# Patient Record
Sex: Male | Born: 1985 | Race: Asian | Hispanic: No | Marital: Single | Smoking: Current every day smoker
Health system: Southern US, Community
[De-identification: ages and names within clinical notes are randomized; demographics above are authoritative.]

## PROBLEM LIST (undated history)

## (undated) DIAGNOSIS — F1210 Cannabis abuse, uncomplicated: Secondary | ICD-10-CM

## (undated) DIAGNOSIS — S0230XA Fracture of orbital floor, unspecified side, initial encounter for closed fracture: Secondary | ICD-10-CM

## (undated) DIAGNOSIS — R05 Cough: Secondary | ICD-10-CM

## (undated) HISTORY — PX: NO PAST SURGERIES: SHX2092

---
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
## 2006-11-10 ENCOUNTER — Emergency Department (HOSPITAL_COMMUNITY): Admission: EM | Admit: 2006-11-10 | Discharge: 2006-11-10 | Payer: Self-pay | Admitting: Family Medicine

## 2007-09-24 ENCOUNTER — Emergency Department (HOSPITAL_COMMUNITY): Admission: EM | Admit: 2007-09-24 | Discharge: 2007-09-24 | Payer: Self-pay | Admitting: Emergency Medicine

## 2007-11-22 ENCOUNTER — Emergency Department (HOSPITAL_COMMUNITY): Admission: EM | Admit: 2007-11-22 | Discharge: 2007-11-22 | Payer: Self-pay | Admitting: Emergency Medicine

## 2010-04-26 ENCOUNTER — Inpatient Hospital Stay (INDEPENDENT_AMBULATORY_CARE_PROVIDER_SITE_OTHER)
Admission: RE | Admit: 2010-04-26 | Discharge: 2010-04-26 | Disposition: A | Discharge status: Home / Self Care | Payer: Self-pay | Source: Ambulatory Visit | Attending: Emergency Medicine | Admitting: Emergency Medicine

## 2010-04-26 DIAGNOSIS — T148XXA Other injury of unspecified body region, initial encounter: Secondary | ICD-10-CM

## 2010-05-05 ENCOUNTER — Inpatient Hospital Stay (HOSPITAL_COMMUNITY)
Admission: RE | Admit: 2010-05-05 | Discharge: 2010-05-05 | Disposition: A | Discharge status: Home / Self Care | Payer: Self-pay | Source: Ambulatory Visit

## 2010-12-13 LAB — POCT RAPID STREP A: Streptococcus, Group A Screen (Direct): NEGATIVE

## 2011-08-22 ENCOUNTER — Encounter (HOSPITAL_COMMUNITY): Payer: Self-pay | Admitting: *Deleted

## 2011-08-22 ENCOUNTER — Emergency Department (HOSPITAL_COMMUNITY)
Admission: EM | Admit: 2011-08-22 | Discharge: 2011-08-23 | Disposition: A | Discharge status: Home / Self Care | Payer: 59 | Attending: Emergency Medicine | Admitting: Emergency Medicine

## 2011-08-22 ENCOUNTER — Emergency Department (HOSPITAL_COMMUNITY): Payer: 59

## 2011-08-22 DIAGNOSIS — S01502A Unspecified open wound of oral cavity, initial encounter: Secondary | ICD-10-CM | POA: Insufficient documentation

## 2011-08-22 DIAGNOSIS — R51 Headache: Secondary | ICD-10-CM | POA: Insufficient documentation

## 2011-08-22 DIAGNOSIS — S0280XA Fracture of other specified skull and facial bones, unspecified side, initial encounter for closed fracture: Secondary | ICD-10-CM | POA: Insufficient documentation

## 2011-08-22 DIAGNOSIS — S0230XA Fracture of orbital floor, unspecified side, initial encounter for closed fracture: Secondary | ICD-10-CM

## 2011-08-22 DIAGNOSIS — R1110 Vomiting, unspecified: Secondary | ICD-10-CM | POA: Insufficient documentation

## 2011-08-22 DIAGNOSIS — W208XXA Other cause of strike by thrown, projected or falling object, initial encounter: Secondary | ICD-10-CM | POA: Insufficient documentation

## 2011-08-22 DIAGNOSIS — R079 Chest pain, unspecified: Secondary | ICD-10-CM | POA: Insufficient documentation

## 2011-08-22 DIAGNOSIS — F17200 Nicotine dependence, unspecified, uncomplicated: Secondary | ICD-10-CM | POA: Insufficient documentation

## 2011-08-22 DIAGNOSIS — S02839A Fracture of medial orbital wall, unspecified side, initial encounter for closed fracture: Secondary | ICD-10-CM

## 2011-08-22 HISTORY — DX: Fracture of orbital floor, unspecified side, initial encounter for closed fracture: S02.30XA

## 2011-08-22 NOTE — ED Provider Notes (Signed)
History     CSN: [id number]  Arrival date & time 08/22/11  1958   First MD Initiated Contact with Patient 08/22/11 2102      Chief Complaint  Patient presents with  . Head Injury  . Chest Pain  . Dizziness    (Consider location/radiation/quality/duration/timing/severity/associated sxs/prior treatment) HPI Comments: Patient states that he was working on a car when the hood of the car fell and struck him on the back of the head causing his face to strike the motor of the car.  No LOC at the time but he reports continued dizziness and vomiting x 1 in the parking lot here.  He denies neck pain but also reports pain to left anterior chest wall as well.  Has small laceration under the left eye that is hemostatic.  He reports no further facial injuries with the exception of small laceration to inside of lower lip that is less than 1cm.  Teeth intact, able to open and close jaw without difficulty.  Patient is a 26 y.o. male presenting with head injury and chest pain. The history is provided by the patient. No language interpreter was used.  Head Injury  The incident occurred 3 to 5 hours ago. He came to the ER via walk-in. The injury mechanism was a direct blow. There was no loss of consciousness. The volume of blood lost was minimal. The quality of the pain is described as sharp. The pain is at a severity of 7/10. The pain is moderate. The pain has been constant since the injury. Associated symptoms include vomiting. Pertinent negatives include no numbness, no blurred vision, no tinnitus, patient does not experience disorientation, no weakness and no memory loss. He has tried nothing for the symptoms. The treatment provided no relief.  Chest Pain Primary symptoms include vomiting.  Pertinent negatives for associated symptoms include no numbness and no weakness.     History reviewed. No pertinent past medical history.  History reviewed. No pertinent past surgical history.  No family history  on file.  History  Substance Use Topics  . Smoking status: Current Everyday Smoker -- 0.5 packs/day  . Smokeless tobacco: Not on file  . Alcohol Use: No      Review of Systems  HENT: Positive for facial swelling. Negative for nosebleeds, neck pain, neck stiffness, dental problem and tinnitus.   Eyes: Negative for blurred vision, pain and visual disturbance.  Cardiovascular: Positive for chest pain.  Gastrointestinal: Positive for vomiting.  Neurological: Positive for headaches. Negative for weakness and numbness.  Psychiatric/Behavioral: Negative for memory loss.  All other systems reviewed and are negative.    Allergies  Review of patient's allergies indicates no known allergies.  Home Medications  No current outpatient prescriptions on file.  BP 120/81  Pulse 70  Temp 97.4 F (36.3 C) (Oral)  Resp 18  SpO2 99%  Physical Exam  Nursing note and vitals reviewed. Constitutional: He is oriented to person, place, and time. He appears well-developed and well-nourished. No distress.  HENT:  Head: Normocephalic.  Right Ear: External ear normal.  Left Ear: External ear normal.  Nose: Nose normal.  Mouth/Throat: No oropharyngeal exudate.       0.25 cm laceration to inside of lower lip.  1cm abrasion to left lower eyelid at the orbital rim with eccymosis.  Eyes: EOM are normal. Pupils are equal, round, and reactive to light. No scleral icterus.       Blurring of vision with upward gaze, no nystagmus, no hyphema.  No disconjugate gaze but patient reports double vision.  Neck: Normal range of motion. Neck supple. No spinous process tenderness and no muscular tenderness present.  Cardiovascular: Normal rate, regular rhythm and normal heart sounds.  Exam reveals no gallop and no friction rub.   No murmur heard. Pulmonary/Chest: Effort normal and breath sounds normal. No respiratory distress. He has no wheezes. He has no rales. He exhibits tenderness.         Mild ttp to left  anterior chest wall without crepitus, deformity, flail segment.  Abdominal: Soft. Bowel sounds are normal. He exhibits no distension. There is no tenderness. There is no rebound and no guarding.  Musculoskeletal: Normal range of motion. He exhibits no edema and no tenderness.  Lymphadenopathy:    He has no cervical adenopathy.  Neurological: He is alert and oriented to person, place, and time. No cranial nerve deficit. He exhibits normal muscle tone. Coordination normal.  Skin: Skin is warm and dry. No rash noted. No erythema. No pallor.  Psychiatric: He has a normal mood and affect. His behavior is normal. Judgment and thought content normal.    ED Course  Procedures (including critical care time)  Labs Reviewed - No data to display Ct Head Wo Contrast  08/22/2011  *RADIOLOGY REPORT*  Clinical Data:    Head injury.  Hit in head by car part.  Nausea and vomiting. Headache.  Laceration over left eye.  CT HEAD WITHOUT CONTRAST  Technique:  Contiguous axial images were obtained from the base of the skull through the vertex without contrast.  Comparison: None.  Findings: There is no evidence for acute stroke, intracranial hemorrhage, mass lesion, hydrocephalus, or extra-axial fluid. There is no atrophy or white matter disease.  There is slight left supraorbital soft tissue swelling.  Within limits of detection on routine head CT, both globes appear intact. On images 10 and 11, there is discontinuity of the lamina papyracea suggesting a medial blowout fracture.  It is unclear if this is acute, as there is little if any surrounding fluid.  Incompletely evaluated is an air-fluid level in the left maxillary sinus.  The left orbital floor appears displaced downward.  It is unclear if this could represent an acute injury, or may be a chronic injury.  Therefore CT maxillofacial recommended for further evaluation.  IMPRESSION: No intracranial hemorrhage or skull fracture. Slight left supraorbital soft tissue  swelling.  Air-fluid level in the left maxillary sinus with a deformity of the left orbital floor could indicate an occluded blowout injury.  CT maxillofacial recommended.  Medial left orbital blowout fracture of indeterminate age.  Again CT maxillofacial recommended.  Original Report Authenticated By: Elsie Stain, M.D.   Results for orders placed during the hospital encounter of 11/22/07  POCT RAPID STREP A (DEVICE)      Component Value Range   Streptococcus, Group A Screen (Direct) NEGATIVE     Ct Head Wo Contrast  08/22/2011  *RADIOLOGY REPORT*  Clinical Data:    Head injury.  Hit in head by car part.  Nausea and vomiting. Headache.  Laceration over left eye.  CT HEAD WITHOUT CONTRAST  Technique:  Contiguous axial images were obtained from the base of the skull through the vertex without contrast.  Comparison: None.  Findings: There is no evidence for acute stroke, intracranial hemorrhage, mass lesion, hydrocephalus, or extra-axial fluid. There is no atrophy or white matter disease.  There is slight left supraorbital soft tissue swelling.  Within limits of detection on routine  head CT, both globes appear intact. On images 10 and 11, there is discontinuity of the lamina papyracea suggesting a medial blowout fracture.  It is unclear if this is acute, as there is little if any surrounding fluid.  Incompletely evaluated is an air-fluid level in the left maxillary sinus.  The left orbital floor appears displaced downward.  It is unclear if this could represent an acute injury, or may be a chronic injury.  Therefore CT maxillofacial recommended for further evaluation.  IMPRESSION: No intracranial hemorrhage or skull fracture. Slight left supraorbital soft tissue swelling.  Air-fluid level in the left maxillary sinus with a deformity of the left orbital floor could indicate an occluded blowout injury.  CT maxillofacial recommended.  Medial left orbital blowout fracture of indeterminate age.  Again CT  maxillofacial recommended.  Original Report Authenticated By: Elsie Stain, M.D.   Ct Maxillofacial Wo Cm  08/22/2011  *RADIOLOGY REPORT*  Clinical Data: Head injury  CT MAXILLOFACIAL WITHOUT CONTRAST  Technique:  Multidetector CT imaging of the maxillofacial structures was performed. Multiplanar CT image reconstructions were also generated.  Comparison: 08/22/2011 head CT  Findings: Left orbital floor fracture with herniation of the extraconal fat and inferior rectus muscle into the defect.  There is mild associated stranding / thickening of the herniated muscle. Left medial orbital wall fracture with herniation of extracoronal fat.  The medial rectus muscle deviates toward the defect. The globes are symmetric. No exophthalmos.  The lenses are located. There is mild stranding of the intraconal fat (image 48 series 3) however no large retrobulbar hematoma.  Underpneumatized right frontal sinus.    Small amount of blood layers dependently within the left maxillary sinus. Otherwise, the visualized paranasal sinuses are clear.  Intact nasal bones, nasal septum, pterygoid plates, zygomatic arches, and mandible.  Upper cervical spine intact.  IMPRESSION: Left inferior and medial orbital wall fractures.  There is herniation of extracoronal fat into the defects.  In addition, the inferior rectus muscle herniates into the floor defect/maxillary sinus and demonstrates mild surrounding stranding.  Recommend clinical correlation for entrapment.  There is mild stranding of the intraconal fat however no large retrobulbar hematoma.  Discussed via telephone with Dr. Rubin Payor 11:40 p.m. on 08/22/2011.  Original Report Authenticated By: Waneta Martins, M.D.      Orbital fracture   MDM  Patient here with left inferior and medial wall fractures with entrapment of the inferior rectus muscle likely, clinically patient with double vision but no noticeable disconjugate gaze.  I have spoken with Dr. Lazarus Salines with ENT who  will see the patient in the office early next week.  He recommends opthalmology follow up as well as abx, ice and elevation.  I have spoken as well to Dr. Luciana Axe with opthalmology who will see the patient in his office in the morning.  Though the patient has double vision there is no blurring of vision and no evidence of hyphema.  Patient and his wife are aware of the plan.        Izola Price [place], [place] 08/23/11 0041

## 2011-08-22 NOTE — ED Notes (Signed)
Received report, pt. Alert and oriented, awaiting CT scan

## 2011-08-22 NOTE — ED Notes (Addendum)
Here s/p hood of car fell and hit head, c/o HA, CP & dizziness, also L eye pain (abrasion noted) scant blood in mouth, teeth and lip noted. "Sx intermitant, comes and goes based off of position and movement". Went to [facility] first, but they were closed. Vomited x1 in parking lot prior to triage. Also, "L eye is shaky & jaw is sore". (Denies: LOC, loose teeth, neck or back pain). Pt alert, NAD, calm interactive, skin W&D, resps e/u, speaking in clear complete sentences, "feels better lying down". L sclera redder than R. PERRL.

## 2011-08-22 NOTE — ED Notes (Signed)
Acuity changed d/t imaging results.

## 2011-08-23 MED ORDER — HYDROCODONE-ACETAMINOPHEN 5-325 MG PO TABS: 1.0000 | ORAL_TABLET | ORAL | Status: AC | PRN

## 2011-08-23 MED ORDER — CEPHALEXIN 500 MG PO CAPS: 500.0000 mg | ORAL_CAPSULE | Freq: Four times a day (QID) | ORAL | Status: AC

## 2011-08-23 NOTE — ED Notes (Signed)
Pt. Alert and oriented, ambulatory, gait steady, NAD noted

## 2011-08-23 NOTE — Discharge Instructions (Signed)
Orbital Floor Fracture, Blowout  The eye sits in the bony structure of the skull called the orbit. The upper and outside walls of the orbit are very thick and strong. These walls protect the eye if the head is struck from the top or side of the eye. However, the inside wall near the nose and the orbit floor are very thin and weak. The bony floor of the orbit also acts as the roof of the air-filled space (sinus) below the orbit.  If the eye receives a direct blow from the front, all the tissues around the eye are briefly pressed together. This makes the orbital wall pressure very high. Since the weakest walls tend to give way first, the inside wall or the orbit floor may break. If the floor fractures, the tissues around the eye, including the muscle that is used to make the eye look down, may become trapped within the fracture as the floor of the orbit "blows out" into the sinus below.   CAUSES   Orbital floor fractures are caused by direct (blunt) trauma to the region of the eye.  SYMPTOMS   Assuming that there has been no injury to the eye itself, symptoms can include:   Puffiness (swelling) and bruising around the eye area (black eye).   A gurgling sound when pressure is placed on the eye area. This sound comes from air that has escaped from the sinus into the space around the eye (orbital emphysema).   Seeing two of everything - one object being higher than the other (vertical diplopia). This is the result of the muscle that moves the eye down being trapped within the fracture. Since it cannot relax, the eye is being held in a downward position relative to the other eye and cannot look up. Vertical diplopia from an orbital floor fracture is worse when looking up.   Pain around the eye when looking up.   One eye looks sunken compared to the other eye (enophthalmos).   Numbness of the cheek and upper gum on the same side of the face with the floor fracture. This is a result of nerve injury to these areas.  This nerve runs in a groove along the bone of the orbital floor on its way to the cheek and upper gums.  DIAGNOSIS   The diagnosis of an orbital floor fracture is suspected during an eye exam by an ophthalmologist. It is confirmed by X-rays or CT scan of the eye region.  TREATMENT    Orbital floor fractures are not usually treated until all of the swelling around the eye has gone away. This may take 1 or 2 weeks. Once the swelling has gone down, an ophthalmologist will see if if the muscle below the eye is still trapped within the fracture.   If there is no sign of a trapped muscle or vertical diplopia, treatment is not necessary.   If there is double vision only when looking up, a decision may be made to not do anything since most people do not spend a lot of time looking up. This may depend on the person's profession. For instance, a plumber or electrician may spend a large part of their day looking up and would therefore need treatment.   If there is persistent vertical double vision even when looking straight ahead, the ophthalmologist may try to free the muscle in the office. If this is unsuccessful, surgery is often needed.  SEEK IMMEDIATE MEDICAL CARE IF:   You have had   A drop in vision in either eye.   Swelling and bruising around either eye.   One eye seems to be "sunken" compared to the other.   You see two of everything with both eyes open when looking in any direction.   The two images get further apart when looking in a certain direction - especially up.   You have numbness of the cheek and upper gums on the side of the injury.   You develop an unexplained oral temperature over 102 F (38.9 C), or as your caregiver suggests.  Document Released: 08/22/2000 Document Revised: 02/15/2011 Document Reviewed: 06/23/2007 [facility] Patient Information 2012 [place], [place].Orbital Floor Fracture Care After Refer to this sheet in the  next few weeks. These instructions provide you with information on caring for yourself after your procedure. Your caregiver may also give you more specific instructions. Your treatment has been planned according to current medical practices, but problems sometimes occur. Call your caregiver if you have any problems or questions after your procedure. HOME CARE INSTRUCTIONS   Follow your caregiver's instructions about activities, exercises, wearing contacts, and driving a car.   At home, an ice pack applied to your surgery site may help with discomfort and keep the swelling down.   Only take over-the-counter or prescription medicines for pain, discomfort, or fever as directed by your caregiver.   Avoid bending over and lifting until your caregiver says it is okay.  SEEK MEDICAL CARE IF:   You develop changes in vision, loss of vision, or double vision.   You develop increasing numbness in the cheek.   You notice a bad smell coming from the wound or dressing.   You notice pus or discharge coming from the wound or around the eye.   You develop increased bruising around the eye (orbit).   You develop redness, swelling, or increasing pain from the wound.   You or your child has an oral temperature above 102 F (38.9 C).  SEEK IMMEDIATE MEDICAL CARE IF:   You develop a rash.   You have difficulty breathing.  Document Released: 09/15/2004 Document Revised: 02/15/2011 Document Reviewed: 10/15/2007 [facility] Patient Information 2012 [place], [place].Facial Fracture A facial fracture is a break in one of the bones of your face. HOME CARE INSTRUCTIONS   Protect the injured part of your face until it is healed.   Do not participate in activities which give chance for re-injury until your doctor approves.   Gently wash and dry your face.   Wear head and facial protection while riding a bicycle, motorcycle, or snowmobile.  SEEK MEDICAL CARE IF:   An oral temperature above 102 F (38.9 C)  develops.   You have severe headaches or notice changes in your vision.   You have new numbness or tingling in your face.   You develop nausea (feeling sick to your stomach), vomiting or a stiff neck.  SEEK IMMEDIATE MEDICAL CARE IF:   You develop difficulty seeing or experience double vision.   You become dizzy, lightheaded, or faint.   You develop trouble speaking, breathing, or swallowing.   You have a watery discharge from your nose or ear.  MAKE SURE YOU:   Understand these instructions.   Will watch your condition.   Will get help right away if you are not doing well or get worse.  Document Released: 02/26/2005 Document Revised: 02/15/2011 Document Reviewed: 10/16/2007 [facility] Patient Information 2012 [place], [place].

## 2011-08-24 NOTE — ED Provider Notes (Signed)
Medical screening examination/treatment/procedure(s) were performed by non-physician practitioner and as supervising physician I was immediately available for consultation/collaboration.    Kayda Allers L Bransen [place], MD 08/24/11 1552 

## 2011-08-27 ENCOUNTER — Encounter (HOSPITAL_BASED_OUTPATIENT_CLINIC_OR_DEPARTMENT_OTHER): Payer: Self-pay | Admitting: *Deleted

## 2011-08-27 DIAGNOSIS — R059 Cough, unspecified: Secondary | ICD-10-CM

## 2011-08-27 HISTORY — DX: Cough, unspecified: R05.9

## 2011-08-27 NOTE — H&P (Signed)
Phillip Mathews, Phillip Mathews 26 y.o., male [id number]     Chief Complaint: LEFT medial and inferior orbital blowout fracture  HPI: 26 year old [place] male had the hood of the car come down on him as he was working under the hood.  This was 5 days ago.  He was evaluated in the emergency room and diagnosed with a large LEFT inferior and medial orbital blowout fracture.  He was instructed regarding ice, elevation, no nose blowing, and given prescriptions for antibiotics and pain medication.  He saw Dr. Luciana Axe 2 days ago who noted some restriction of upward gaze, and possible early cataract on the LEFT.  Since this time, the swelling has come down, and he feels like his vision is less blurry and range of motion is better and without discomfort.  He is passing some old bloody material from his nose.  On specific questioning, he has some numbness in the medial lower midface on the LEFT.  No prior history of injury.  RIGHT eye seems fine.  No hearing issues.  No jaw teeth or occlusion issues.  No neck pain.  No radiating neurologic symptoms to arms, legs, bowel, bladder.   I reviewed his CT scans.  He has a large LEFT medial and posterior orbital floor and medial wall blow out fracture with herniation of orbital contents into the maxillary and ethmoid sinuses.  He has a retrobulbar hematoma  PMH: Past Medical History  Diagnosis Date  . Orbital floor (blow-out) closed fracture 08/22/2011    left medial/inferior   . Cough 08/27/2011    productive of clear mucus    Surg Hx: Past Surgical History  Procedure Date  . No past surgeries     FHx:  History reviewed. No pertinent family history. SocHx:  reports that he has been smoking Cigarettes.  He has a 3.5 pack-year smoking history. He has never used smokeless tobacco. He reports that he uses illicit drugs (Marijuana). He reports that he does not drink alcohol.  ALLERGIES: No Known Allergies  No prescriptions prior to admission    No results found for  this or any previous visit (from the past 48 hour(s)). No results found.  WUJ:[id number]: Not feeling tired (fatigue).  No fever, no night sweats, and no recent weight loss. Head: No headache. Eyes: No eye symptoms. Otolaryngeal: No hearing loss, no earache, no tinnitus, and no purulent nasal discharge.  No nasal passage blockage, no snoring, no sneezing, no hoarseness, and no sore throat. Cardiovascular: No chest pain or discomfort  and no palpitations. Pulmonary: No dyspnea.  Cough.  No wheezing. Gastrointestinal: No dysphagia, no heartburn, no nausea, no abdominal pain, and no melena.  No diarrhea. Genitourinary: No dysuria. Endocrine: No muscle weakness. Musculoskeletal: No calf muscle cramps, no arthralgias, and no soft tissue swelling. Neurological: No dizziness, no fainting, no tingling, and no numbness. Psychological: No anxiety  and no depression. Skin: No rash. 12 system ROS was obtained and reviewed on the Health Maintenance form dated today.  Positive responses are shown above.  If the symptom is not checked, the patient has denied it.  Height 5\' 6"  (1.676 m).  PHYSICAL EXAM: BP:131/74,  HR: 57 b/min,  This is a pleasant healthy-appearing young adult Asian male.  He speaks good [place] with a mild accident.  Mental status is appropriate.  He hears well in conversational speech.  Voice is clear and respirations unlabored through the nose.  The head is atraumatic.  Bony facial contours are palpably intact.  He has some periorbital  ecchymosis on the LEFT, and slight chemosis and subconjunctival hemorrhage.  Vision is intact each eye.  He seems to have objective and subjective conjugate gaze in all major fields without diplopia.  Pupils are equal round and reactive.  Measuring to the dorsum of his nose, 14 mm on the RIGHT, and 17 mm on the LEFT.   Ears are clear with normal drums.  Internal nose is clear.  Oral cavity is clear with crowded teeth in fair to good repair.  Oropharynx  clear.  Neck unremarkable.  Lungs:  clear to auscultation Heart:  regular rate and rhythm, no murmurs Abd:  soft, active Ext:  nl config Neuro:  Intact and symmetric  Studies Reviewed:  Maxillofacial CT scan.  Dr. Ephriam Knuckles Ophth consult    Assessment/Plan . Open fracture of skull orbital floor (blow-out)   (802.7)   You have a large fracture under your LEFT eye.  This will allow the eye to sink back in over time, and it won't look so good, and may also have some double vision, even if you are OK now.  We are planning on repairing this tomorrow morning.  Once again, ice x 24 hrs elevation x 3-4 days, no nose blowing x 2 weeks.  recheck here 10 days.  We will observe you overnight for 24 hrs after the surgery.  Continue the antibiotics for 10 days after the surgery. Pain medication as needed.  Cephalexin 500 MG Oral Capsule;TAKE 1 CAPSULE 4 TIMES DAILY UNTIL GONE; Qty30; R0; Rx. Hydrocodone-Acetaminophen 5-325 MG Oral Tablet;TAKE 1 TO 2 TABLETS EVERY 4 TO 6 HOURS AS NEEDED FOR PAIN; Qty30; R2; Rx.  Phillip Mathews, Phillip Mathews 08/27/2011, 11:42 PM

## 2011-08-28 ENCOUNTER — Encounter (HOSPITAL_BASED_OUTPATIENT_CLINIC_OR_DEPARTMENT_OTHER): Payer: Self-pay

## 2011-08-28 ENCOUNTER — Encounter (HOSPITAL_BASED_OUTPATIENT_CLINIC_OR_DEPARTMENT_OTHER)
Admission: RE | Disposition: A | Discharge status: Home / Self Care | Payer: Self-pay | Source: Ambulatory Visit | Attending: Otolaryngology

## 2011-08-28 ENCOUNTER — Ambulatory Visit (HOSPITAL_BASED_OUTPATIENT_CLINIC_OR_DEPARTMENT_OTHER)
Admission: RE | Admit: 2011-08-28 | Discharge: 2011-08-28 | Disposition: A | Discharge status: Home / Self Care | Payer: 59 | Source: Ambulatory Visit | Attending: Otolaryngology | Admitting: Otolaryngology

## 2011-08-28 ENCOUNTER — Encounter (HOSPITAL_BASED_OUTPATIENT_CLINIC_OR_DEPARTMENT_OTHER): Payer: Self-pay | Admitting: Anesthesiology

## 2011-08-28 ENCOUNTER — Ambulatory Visit (HOSPITAL_BASED_OUTPATIENT_CLINIC_OR_DEPARTMENT_OTHER): Payer: 59 | Admitting: Anesthesiology

## 2011-08-28 DIAGNOSIS — W208XXA Other cause of strike by thrown, projected or falling object, initial encounter: Secondary | ICD-10-CM | POA: Insufficient documentation

## 2011-08-28 DIAGNOSIS — S0230XA Fracture of orbital floor, unspecified side, initial encounter for closed fracture: Secondary | ICD-10-CM | POA: Insufficient documentation

## 2011-08-28 DIAGNOSIS — Y998 Other external cause status: Secondary | ICD-10-CM | POA: Insufficient documentation

## 2011-08-28 DIAGNOSIS — K219 Gastro-esophageal reflux disease without esophagitis: Secondary | ICD-10-CM | POA: Insufficient documentation

## 2011-08-28 DIAGNOSIS — F17200 Nicotine dependence, unspecified, uncomplicated: Secondary | ICD-10-CM | POA: Insufficient documentation

## 2011-08-28 HISTORY — DX: Cough: R05

## 2011-08-28 HISTORY — DX: Fracture of orbital floor, unspecified side, initial encounter for closed fracture: S02.30XA

## 2011-08-28 HISTORY — PX: ORIF ORBITAL FRACTURE: SHX5312

## 2011-08-28 SURGERY — OPEN REDUCTION INTERNAL FIXATION (ORIF) ORBITAL FRACTURE
Anesthesia: General | Site: Eye | Laterality: Left | Wound class: Clean

## 2011-08-28 MED ORDER — MIDAZOLAM HCL 2 MG/2ML IJ SOLN: 0.5000 mg | Freq: Once | INTRAMUSCULAR | Status: DC | PRN

## 2011-08-28 MED ORDER — MORPHINE SULFATE 2 MG/ML IJ SOLN: 1.0000 mg | INTRAMUSCULAR | Status: DC | PRN

## 2011-08-28 MED ORDER — LIDOCAINE HCL (CARDIAC) 20 MG/ML IV SOLN
INTRAVENOUS | Status: DC | PRN
  Administered 2011-08-28: 50 mg via INTRAVENOUS

## 2011-08-28 MED ORDER — MIDAZOLAM HCL 5 MG/5ML IJ SOLN
INTRAMUSCULAR | Status: DC | PRN
  Administered 2011-08-28: 2 mg via INTRAVENOUS

## 2011-08-28 MED ORDER — NEOMYCIN-POLYMYXIN-DEXAMETH 3.5-10000-0.1 OP OINT
TOPICAL_OINTMENT | OPHTHALMIC | Status: DC | PRN
  Administered 2011-08-28: 1 via OPHTHALMIC

## 2011-08-28 MED ORDER — BACITRA-NEOMYCIN-POLYMYXIN-HC 1 % OP OINT: TOPICAL_OINTMENT | Freq: Four times a day (QID) | OPHTHALMIC | Status: DC

## 2011-08-28 MED ORDER — MEPERIDINE HCL 25 MG/ML IJ SOLN: 6.2500 mg | INTRAMUSCULAR | Status: DC | PRN

## 2011-08-28 MED ORDER — CEFAZOLIN SODIUM 1-5 GM-% IV SOLN
1.0000 g | INTRAVENOUS | Status: DC
  Administered 2011-08-28: 1 g via INTRAVENOUS

## 2011-08-28 MED ORDER — CEPHALEXIN 500 MG PO CAPS: 500.0000 mg | ORAL_CAPSULE | Freq: Four times a day (QID) | ORAL | Status: DC

## 2011-08-28 MED ORDER — BSS IO SOLN
INTRAOCULAR | Status: DC | PRN
  Administered 2011-08-28: 1 via INTRAOCULAR

## 2011-08-28 MED ORDER — CEPHALEXIN 500 MG PO CAPS
500.0000 mg | ORAL_CAPSULE | Freq: Four times a day (QID) | ORAL | Status: DC
  Administered 2011-08-28: 500 mg via ORAL

## 2011-08-28 MED ORDER — LACTATED RINGERS IV SOLN
INTRAVENOUS | Status: DC
  Administered 2011-08-28 (×2): via INTRAVENOUS

## 2011-08-28 MED ORDER — ONDANSETRON HCL 4 MG/2ML IJ SOLN
INTRAMUSCULAR | Status: DC | PRN
  Administered 2011-08-28: 4 mg via INTRAVENOUS

## 2011-08-28 MED ORDER — SODIUM CHLORIDE 0.9 % IV SOLN
INTRAVENOUS | Status: DC
  Administered 2011-08-28: 12:00:00 via INTRAVENOUS

## 2011-08-28 MED ORDER — ONDANSETRON HCL 4 MG PO TABS: 4.0000 mg | ORAL_TABLET | ORAL | Status: DC | PRN

## 2011-08-28 MED ORDER — FENTANYL CITRATE 0.05 MG/ML IJ SOLN
INTRAMUSCULAR | Status: DC | PRN
  Administered 2011-08-28 (×2): 25 ug via INTRAVENOUS
  Administered 2011-08-28: 50 ug via INTRAVENOUS
  Administered 2011-08-28: 100 ug via INTRAVENOUS
  Administered 2011-08-28: 25 ug via INTRAVENOUS
  Administered 2011-08-28: 50 ug via INTRAVENOUS
  Administered 2011-08-28: 25 ug via INTRAVENOUS

## 2011-08-28 MED ORDER — PROPOFOL 10 MG/ML IV EMUL
INTRAVENOUS | Status: DC | PRN
  Administered 2011-08-28: 300 mg via INTRAVENOUS

## 2011-08-28 MED ORDER — PROMETHAZINE HCL 25 MG/ML IJ SOLN: 6.2500 mg | INTRAMUSCULAR | Status: DC | PRN

## 2011-08-28 MED ORDER — 0.9 % SODIUM CHLORIDE (POUR BTL) OPTIME
TOPICAL | Status: DC | PRN
  Administered 2011-08-28: 100 mL

## 2011-08-28 MED ORDER — DEXAMETHASONE SODIUM PHOSPHATE 4 MG/ML IJ SOLN
INTRAMUSCULAR | Status: DC | PRN
  Administered 2011-08-28: 10 mg via INTRAVENOUS

## 2011-08-28 MED ORDER — HYDROCODONE-ACETAMINOPHEN 5-325 MG PO TABS: 1.0000 | ORAL_TABLET | ORAL | Status: DC | PRN

## 2011-08-28 MED ORDER — LIDOCAINE-EPINEPHRINE 1 %-1:100000 IJ SOLN
INTRAMUSCULAR | Status: DC | PRN
  Administered 2011-08-28: 4 mL

## 2011-08-28 MED ORDER — OXYCODONE-ACETAMINOPHEN 5-325 MG PO TABS: 1.0000 | ORAL_TABLET | ORAL | Status: AC | PRN

## 2011-08-28 MED ORDER — CEFAZOLIN SODIUM-DEXTROSE 2-3 GM-% IV SOLR: 2.0000 g | Freq: Once | INTRAVENOUS | Status: DC

## 2011-08-28 MED ORDER — ONDANSETRON HCL 4 MG/2ML IJ SOLN: 4.0000 mg | INTRAMUSCULAR | Status: DC | PRN

## 2011-08-28 MED ORDER — HYDROMORPHONE HCL PF 1 MG/ML IJ SOLN
0.2500 mg | INTRAMUSCULAR | Status: DC | PRN
  Administered 2011-08-28 (×2): 0.5 mg via INTRAVENOUS

## 2011-08-28 MED ORDER — SUCCINYLCHOLINE CHLORIDE 20 MG/ML IJ SOLN
INTRAMUSCULAR | Status: DC | PRN
  Administered 2011-08-28: 100 mg via INTRAVENOUS

## 2011-08-28 SURGICAL SUPPLY — 44 items
APPLICATOR DR MATTHEWS STRL (MISCELLANEOUS) IMPLANT
BIT DRILL UPPR FCE 1.0M 6 TWST (DRILL) ×2 IMPLANT
CANISTER SUCTION 1200CC (MISCELLANEOUS) ×3 IMPLANT
CLEANER CAUTERY TIP 5X5 PAD (MISCELLANEOUS) IMPLANT
CLOTH BEACON ORANGE TIMEOUT ST (SAFETY) ×3 IMPLANT
CORDS BIPOLAR (ELECTRODE) ×3 IMPLANT
DECANTER SPIKE VIAL GLASS SM (MISCELLANEOUS) ×3 IMPLANT
DEPRESSOR TONGUE BLADE STERILE (MISCELLANEOUS) IMPLANT
DRILL UPPERFACE 1.0M 6MM TWIST (DRILL) ×3
ELECT NEEDLE BLADE 2-5/6 (NEEDLE) ×3 IMPLANT
ELECT NEEDLE TIP 2.8 STRL (NEEDLE) IMPLANT
ELECT REM PT RETURN 9FT ADLT (ELECTROSURGICAL) ×3
ELECTRODE REM PT RTRN 9FT ADLT (ELECTROSURGICAL) ×2 IMPLANT
GLOVE ECLIPSE 8.0 STRL XLNG CF (GLOVE) ×3 IMPLANT
GLOVE SKINSENSE NS SZ7.0 (GLOVE) ×1
GLOVE SKINSENSE STRL SZ7.0 (GLOVE) ×2 IMPLANT
GOWN PREVENTION PLUS XLARGE (GOWN DISPOSABLE) ×3 IMPLANT
GOWN PREVENTION PLUS XXLARGE (GOWN DISPOSABLE) ×3 IMPLANT
NEEDLE BLUNT 17GA (NEEDLE) ×3 IMPLANT
NEEDLE HYPO 25X1 1.5 SAFETY (NEEDLE) ×3 IMPLANT
NS IRRIG 1000ML POUR BTL (IV SOLUTION) ×3 IMPLANT
PACK BASIN DAY SURGERY FS (CUSTOM PROCEDURE TRAY) ×3 IMPLANT
PACK ENT DAY SURGERY (CUSTOM PROCEDURE TRAY) ×3 IMPLANT
PAD CLEANER CAUTERY TIP 5X5 (MISCELLANEOUS)
PATTIES SURGICAL .5 X3 (DISPOSABLE) IMPLANT
PENCIL BUTTON HOLSTER BLD 10FT (ELECTRODE) ×3 IMPLANT
PLATE ORBITAL FLOOR 30 X 32 (Plate) ×3 IMPLANT
SCREW UPPERFACE 1.2X4M SLFDRIL (Screw) ×9 IMPLANT
SHEILD EYE MED CORNL SHD 22X21 (OPHTHALMIC RELATED) ×3
SHIELD EYE MED CORNL SHD 22X21 (OPHTHALMIC RELATED) ×2 IMPLANT
SLEEVE SCD COMPRESS KNEE MED (MISCELLANEOUS) ×3 IMPLANT
SUT CHROMIC 3 0 PS 2 (SUTURE) IMPLANT
SUT CHROMIC 4 0 P 3 18 (SUTURE) IMPLANT
SUT CHROMIC 4 0 PS 2 18 (SUTURE) IMPLANT
SUT ETHILON 4 0 CL P 3 (SUTURE) IMPLANT
SUT ETHILON 4 0 P 3 18 (SUTURE) IMPLANT
SUT ETHILON 5 0 P 3 18 (SUTURE) ×1
SUT ETHILON 6 0 P 1 (SUTURE) ×3 IMPLANT
SUT NYLON ETHILON 5-0 P-3 1X18 (SUTURE) ×2 IMPLANT
SUT PLAIN 5 0 P 3 18 (SUTURE) ×3 IMPLANT
SYR BULB 3OZ (MISCELLANEOUS) ×3 IMPLANT
TOWEL OR 17X24 6PK STRL BLUE (TOWEL DISPOSABLE) ×3 IMPLANT
TRAY DSU PREP LF (CUSTOM PROCEDURE TRAY) ×3 IMPLANT
WATER STERILE IRR 1000ML POUR (IV SOLUTION) IMPLANT

## 2011-08-28 NOTE — Anesthesia Preprocedure Evaluation (Signed)
Anesthesia Evaluation  Patient identified by MRN, date of birth, ID band Patient awake    Reviewed: Allergy & Precautions, H&P , NPO status , Patient's Chart, lab work & pertinent test results  History of Anesthesia Complications Negative for: history of anesthetic complications  Airway Mallampati: I TM Distance: >3 FB Neck ROM: Full    Dental No notable dental hx. (+) Teeth Intact and Dental Advisory Given   Pulmonary Current Smoker,  breath sounds clear to auscultation  Pulmonary exam normal       Cardiovascular negative cardio ROS  Rhythm:Regular Rate:Normal     Neuro/Psych negative neurological ROS     GI/Hepatic Neg liver ROS, GERD-  Controlled,  Endo/Other  negative endocrine ROS  Renal/GU negative Renal ROS     Musculoskeletal   Abdominal (+) + obese,   Peds  Hematology negative hematology ROS (+)   Anesthesia Other Findings   Reproductive/Obstetrics                           Anesthesia Physical Anesthesia Plan  ASA: II  Anesthesia Plan: General   Post-op Pain Management:    Induction: Intravenous  Airway Management Planned: Oral ETT  Additional Equipment:   Intra-op Plan:   Post-operative Plan: Extubation in OR  Informed Consent: I have reviewed the patients History and Physical, chart, labs and discussed the procedure including the risks, benefits and alternatives for the proposed anesthesia with the patient or authorized representative who has indicated his/her understanding and acceptance.   Dental advisory given  Plan Discussed with: Surgeon and CRNA  Anesthesia Plan Comments: (Plan routine monitors, GETA)        Anesthesia Quick Evaluation

## 2011-08-28 NOTE — Anesthesia Postprocedure Evaluation (Signed)
  Anesthesia Post-op Note  Patient: Phillip Mathews  Procedure(s) Performed: Procedure(s) (LRB): OPEN REDUCTION INTERNAL FIXATION (ORIF) ORBITAL FRACTURE (Left)  Patient Location: PACU  Anesthesia Type: General  Level of Consciousness: awake, alert  and oriented  Airway and Oxygen Therapy: Patient Spontanous Breathing  Post-op Pain: mild  Post-op Assessment: Post-op Vital signs reviewed, Patient's Cardiovascular Status Stable, Respiratory Function Stable, Patent Airway, No signs of Nausea or vomiting and Pain level controlled  Post-op Vital Signs: Reviewed and stable  Complications: No apparent anesthesia complications

## 2011-08-28 NOTE — Anesthesia Procedure Notes (Signed)
Procedure Name: Intubation Date/Time: 08/28/2011 8:01 AM Performed by: Zenia Resides D Pre-anesthesia Checklist: Patient identified, Emergency Drugs available, Suction available, Patient being monitored and Timeout performed Patient Re-evaluated:Patient Re-evaluated prior to inductionOxygen Delivery Method: Circle System Utilized Preoxygenation: Pre-oxygenation with 100% oxygen Intubation Type: IV induction Ventilation: Mask ventilation without difficulty Laryngoscope Size: Mac and 3 Grade View: Grade I Tube type: Oral Number of attempts: 1 Airway Equipment and Method: stylet and oral airway Placement Confirmation: ETT inserted through vocal cords under direct vision,  positive ETCO2 and breath sounds checked- equal and bilateral Secured at: 23 cm Tube secured with: Tape Dental Injury: Teeth and Oropharynx as per pre-operative assessment

## 2011-08-28 NOTE — Transfer of Care (Signed)
Immediate Anesthesia Transfer of Care Note  Patient: Phillip Mathews  Procedure(s) Performed: Procedure(s) (LRB): OPEN REDUCTION INTERNAL FIXATION (ORIF) ORBITAL FRACTURE (Left)  Patient Location: PACU  Anesthesia Type: General  Level of Consciousness: awake, alert  and oriented  Airway & Oxygen Therapy: Patient Spontanous Breathing and Patient connected to face mask oxygen  Post-op Assessment: Report given to PACU RN and Post -op Vital signs reviewed and stable  Post vital signs: Reviewed and stable  Complications: No apparent anesthesia complications

## 2011-08-28 NOTE — Op Note (Signed)
08/28/2011  10:11 AM    Hamstra, Alycia Patten  [id number]   Pre-Op Dx:   left orbital floor blowout fracture  Post-op Dx:  Same  Proc:  Orbital floor exploration and repair, blowout fracture   Surg:  Flo Shanks T MD  Anes:  GOT  EBL:  25 cc  Comp:  None  Findings:  Large comminuted depressed left orbital floor fracture beginning laterally behind the orbital rim and progressing medially across the infraorbital nerve and posteriorly.  Procedure:  With the patient in a comfortable supine position, general orotracheal anesthesia was induced without difficulty. At an appropriate level, the table was turned 90. The patient was placed in a semisitting position. The x-rays were reviewed. 1% Xylocaine with 1 100,000 epinephrine, 4 cc total was infiltrated into the infraorbital rim for intraoperative hemostasis. A sterile preparation and draping of the left midface was accomplished in the standard fashion.    CT scan was reviewed again. A sterile corneal shield was placed. A post septal approach was elected. An incision was made in the inferior conjunctival sac and carried down to the infraorbital rim.  The conjunctival flap was sewn to the upper lid after removing the corneal shield to protect the eye during surgery. The infraorbital rim was incised. Working on a broad front, the periorbita was elevated from the floor. The fracture was identified. The periphery of the fracture was developed on both sides. The orbital contents herniated down into the defect were gently and gradually elevated. Malleable retractors were used to hold back the orbital fat. These were relaxed every 1 to 2 minutes to allow orbital perfusion. The infraorbital nerve was identified in the herniated contents. The orbital fat was developed and elevated above the infraorbital nerve. I did not see evidence of the medial blowout fracture. It was not felt necessary to explore this further. Hemostasis was spontaneous.  At this  point an aluminum foil suture wrapper was cut to form a template and placed into the defect. This was repeated several times until adequate coverage from side to side and encompassing most of the front to back defect was accomplished. Leaving the aluminum foil in place, a titanium orbital floor malleable plate was customized to approximately the same size and shape. This was inserted beneath the foil which did a nice job of keeping orbital fat elevated. The plate was secured with 3 screws to the infraorbital rim. The aluminum template was removed.  Inspection revealed that the plate had adequate curvature to conform to the orbital floor, was supported on both sides, but did not go all the way to the posterior edge of the fracture approaching the orbital apex.  Again hemostasis was observed. The area was thoroughly irrigated.  The conjunctival flap was released and upper lid and the conjunctival incision was closed with a few interrupted 5-0 plain gut sutures. The eye was irrigated with balanced salt solution. Cortisporin Ophthalmic ointment was placed into the conjunctival sac. At this point the procedure was completed. The pharynx was suctioned of a tiny amount of clear secretions.  The patient was returned to anesthesia, awakened, extubated, and transferred to recovery in stable condition.   Dispo:   PACU to 23 hours extended recovery.   Plan:  Ice, elevation, analgesia, antibiosis, avoidance of nose blowing. We will see him back in 1 week. We will check a repeat maxillofacial CT scan around that time to assess the repair.  Cephus Richer MD

## 2011-08-28 NOTE — Discharge Instructions (Signed)
Keep head elevated for 3-4 days. No nose blowing x 2 weeks. Ice pack as desired. OK to rinse throat to clear old blood as needed. OK to return to work, light duty, Friday or Saturday You have your prescriptions for pain medication, and for 10 more days of antibiotics. Call for severe swelling around the eye, severe pain, any change in vision (try to lift your eyelid every 3-4 hours for the first several days and make sure you can still see out of the LEFT eye.) Eye ointment, small amt. To the LEFT eyeball 4 times daily.    Post Anesthesia Home Care Instructions  Activity: Get plenty of rest for the remainder of the day. A responsible adult should stay with you for 24 hours following the procedure.  For the next 24 hours, DO NOT: -Drive a car -Advertising copywriter -Drink alcoholic beverages -Take any medication unless instructed by your physician -Make any legal decisions or sign important papers.  Meals: Start with liquid foods such as gelatin or soup. Progress to regular foods as tolerated. Avoid greasy, spicy, heavy foods. If nausea and/or vomiting occur, drink only clear liquids until the nausea and/or vomiting subsides. Call your physician if vomiting continues.  Special Instructions/Symptoms: Your throat may feel dry or sore from the anesthesia or the breathing tube placed in your throat during surgery. If this causes discomfort, gargle with warm salt water. The discomfort should disappear within 24 hours.

## 2011-08-28 NOTE — Interval H&P Note (Signed)
History and Physical Interval Note:  08/28/2011 7:44 AM  Phillip Mathews  has presented today for surgery, with the diagnosis of left medial/inferior orbital blowout fracture  The various methods of treatment have been discussed with the patient and family. After consideration of risks, benefits and other options for treatment, the patient has consented to  Procedure(s) (LRB): OPEN REDUCTION INTERNAL FIXATION (ORIF) ZYGOMATIC FRACTURE (Left) as a surgical intervention .  The patient's history has been re-reviewed, patient re-examined, no change in status, stable for surgery.  I have re-reviewed the patient's chart and labs.  Questions were answered to the patient's satisfaction.     Flo Shanks

## 2011-08-31 LAB — POCT HEMOGLOBIN-HEMACUE: Hemoglobin: 16.2 g/dL (ref 13.0–17.0)

## 2011-09-05 ENCOUNTER — Encounter (HOSPITAL_BASED_OUTPATIENT_CLINIC_OR_DEPARTMENT_OTHER): Payer: Self-pay | Admitting: Otolaryngology

## 2011-09-12 ENCOUNTER — Other Ambulatory Visit (HOSPITAL_COMMUNITY): Payer: Self-pay | Admitting: Otolaryngology

## 2011-09-12 DIAGNOSIS — T148XXA Other injury of unspecified body region, initial encounter: Secondary | ICD-10-CM

## 2011-09-14 ENCOUNTER — Other Ambulatory Visit (HOSPITAL_COMMUNITY): Payer: 59

## 2011-09-17 ENCOUNTER — Ambulatory Visit (HOSPITAL_COMMUNITY)
Admission: RE | Admit: 2011-09-17 | Discharge: 2011-09-17 | Disposition: A | Discharge status: Home / Self Care | Payer: 59 | Source: Ambulatory Visit | Attending: Otolaryngology | Admitting: Otolaryngology

## 2011-09-17 DIAGNOSIS — Z09 Encounter for follow-up examination after completed treatment for conditions other than malignant neoplasm: Secondary | ICD-10-CM | POA: Insufficient documentation

## 2011-09-17 DIAGNOSIS — T148XXA Other injury of unspecified body region, initial encounter: Secondary | ICD-10-CM

## 2011-09-17 DIAGNOSIS — H532 Diplopia: Secondary | ICD-10-CM | POA: Insufficient documentation
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
## 2014-01-09 IMAGING — CT CT MAXILLOFACIAL W/O CM
3 of 5 series · 16 of 47 positions shown, 19 images · non-contrast
Comparison: 08/22/2011 head CT

CLINICAL DATA: Head injury

CT MAXILLOFACIAL WITHOUT CONTRAST
TECHNIQUE: Multidetector CT imaging of the maxillofacial
structures was performed. Multiplanar CT image reconstructions were
also generated.

[Series 3: recon 2: supine facial bones · axial · 0.39mm/px · z∈[+26,+166]mm · 12 of 66 slices shown, 15 images]
[im 5/66  brain]
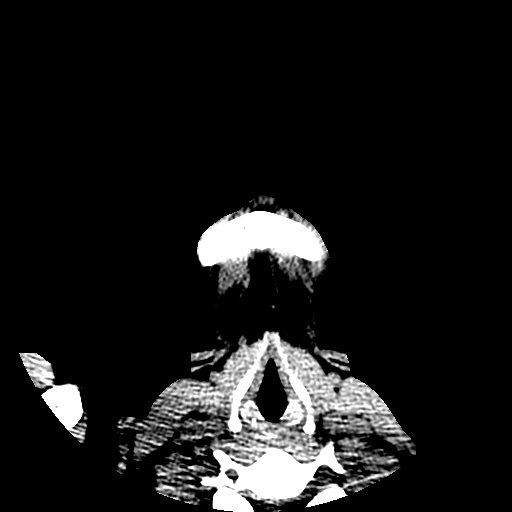
[im 5/66  bone]
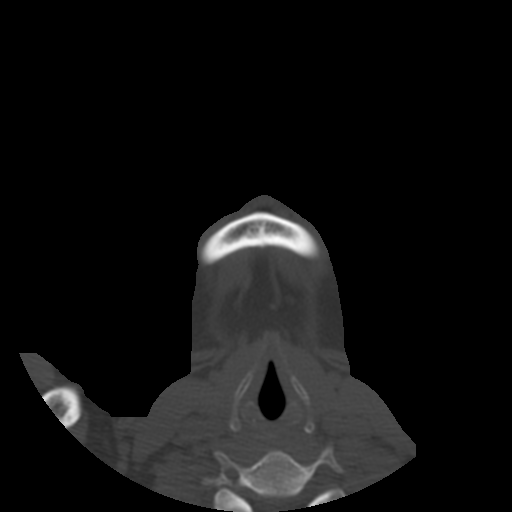
[im 9/66  bone]
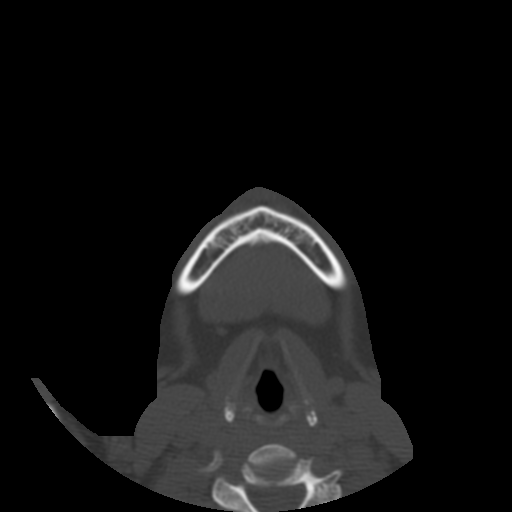
[im 14/66  bone]
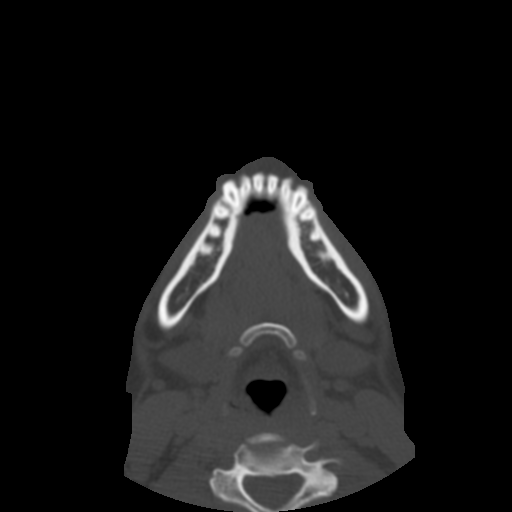
[im 21/66  bone]
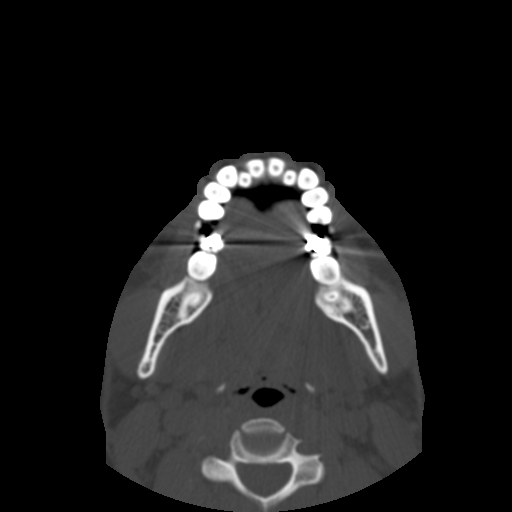
[im 25/66  brain]
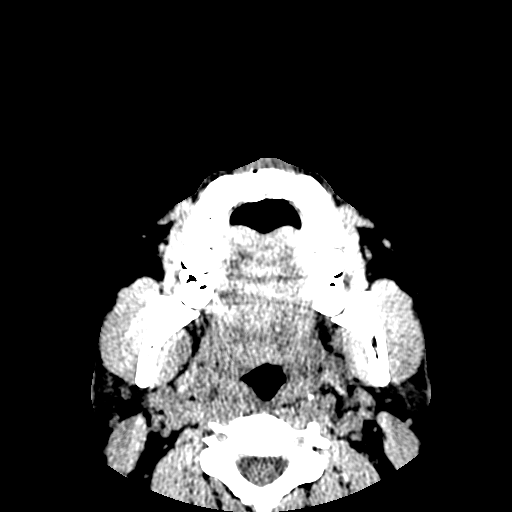
[im 25/66  bone]
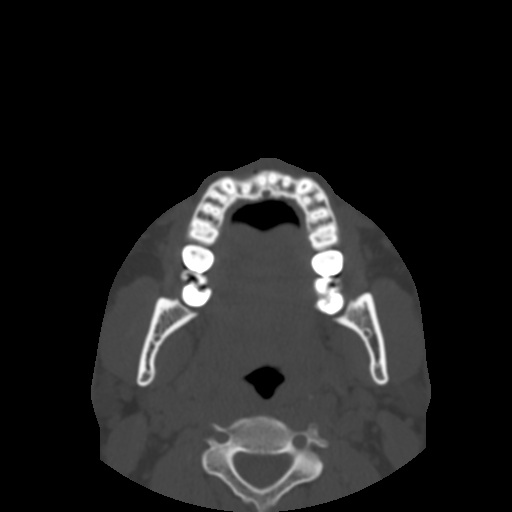
[im 30/66  bone]
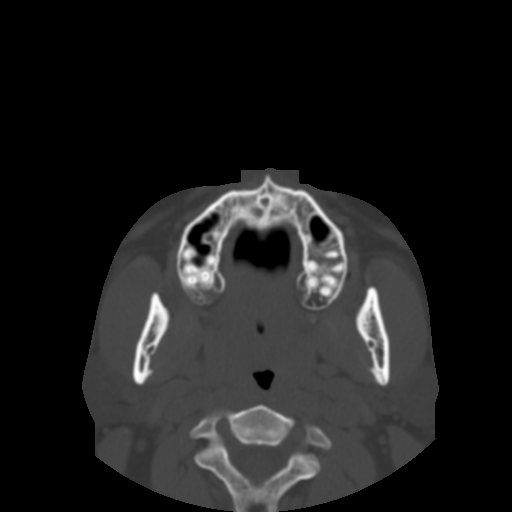
[im 36/66  bone]
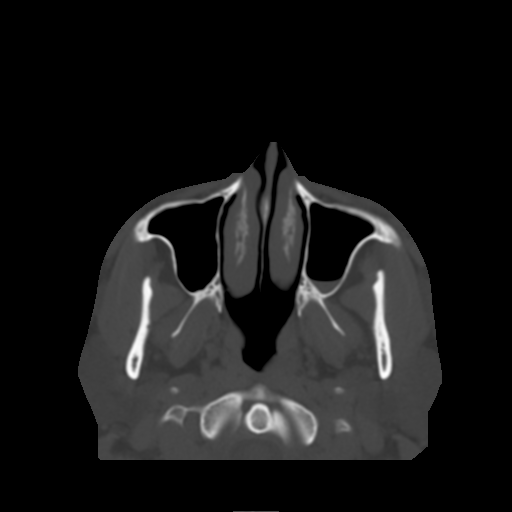
[im 41/66  bone]
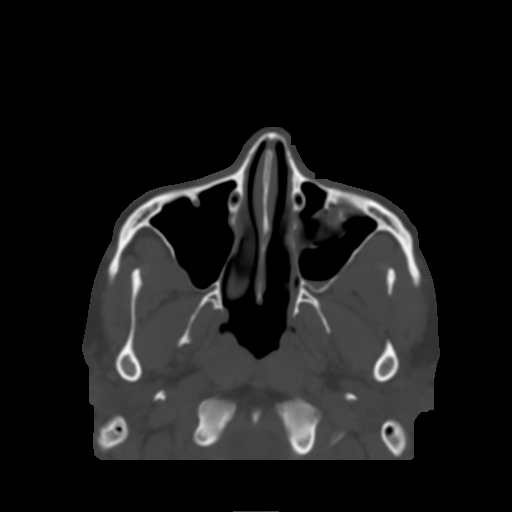
[im 45/66  brain]
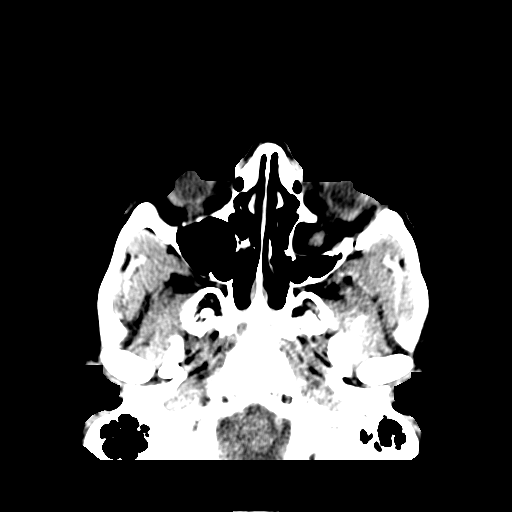
[im 45/66  bone]
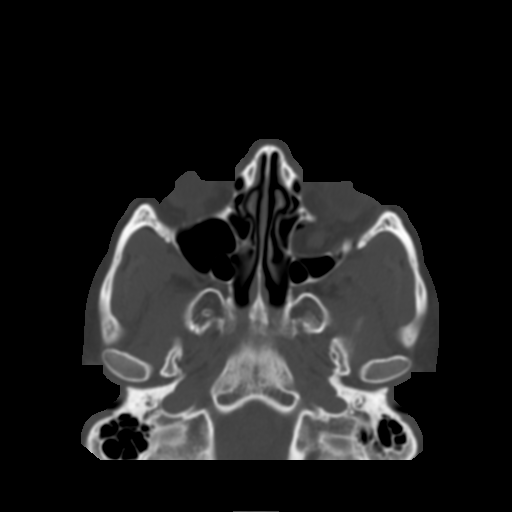
[im 52/66  bone]
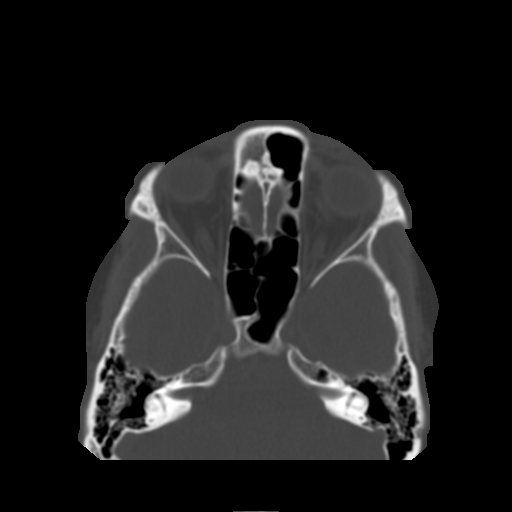
[im 57/66  bone]
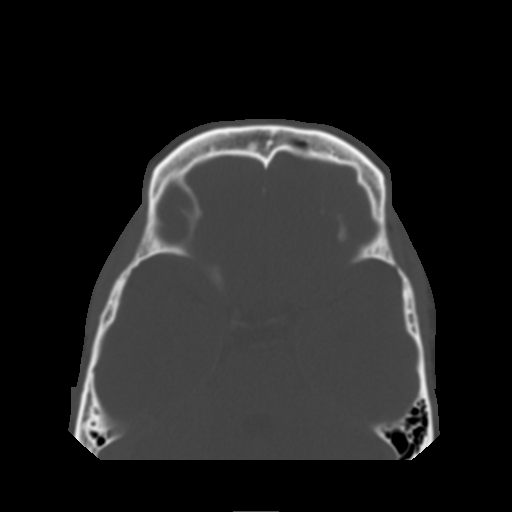
[im 61/66  bone]
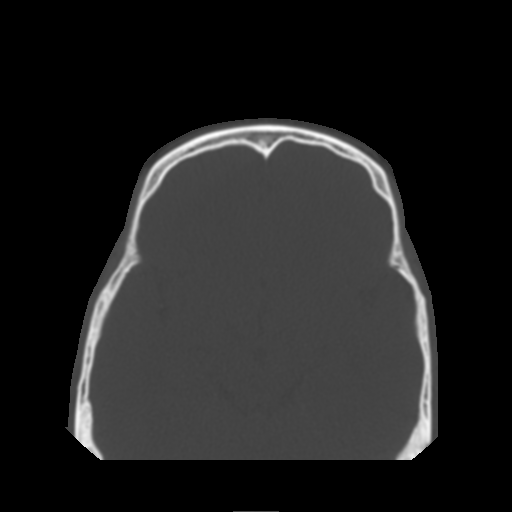

[Series 402: cor bone · coronal · 0.39mm/px · 3 of 78 slices shown]
[im 26/78  bone]
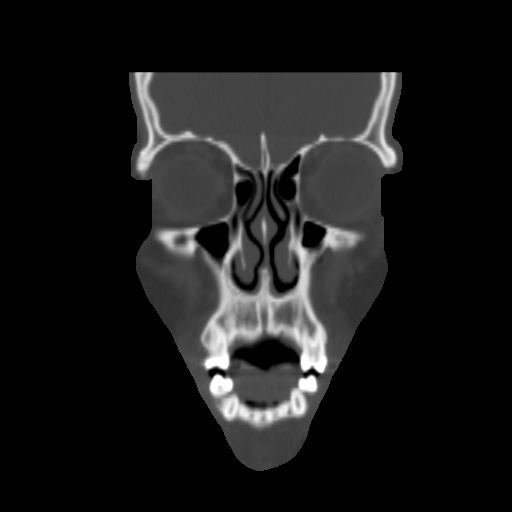
[im 39/78  bone]
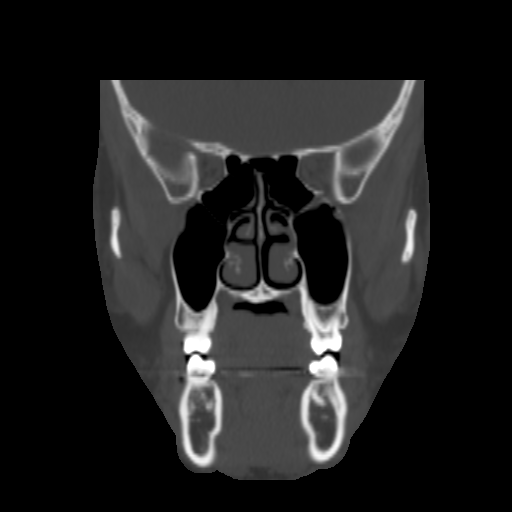
[im 52/78  bone]
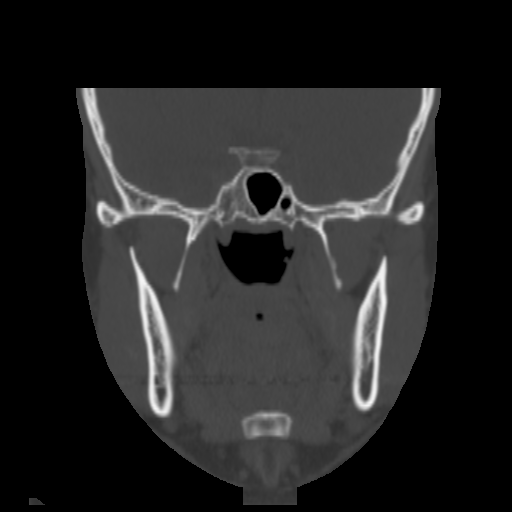

[Series 403: sag bone · sagittal · 0.39mm/px · 1 of 78 slices shown]
[im 39/78  bone]
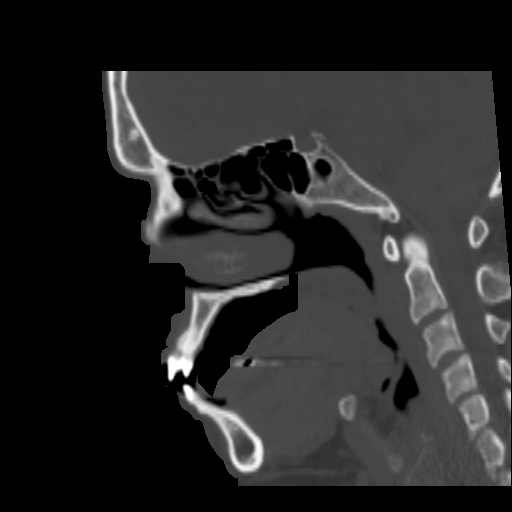

[16 of 47 positions shown; findings below may reference images not displayed]

FINDINGS: Left orbital floor fracture with herniation of the
extraconal fat and inferior rectus muscle into the defect.  There
is mild associated stranding / thickening of the herniated muscle.
Left medial orbital wall fracture with herniation of extracoronal
fat.  The medial rectus muscle deviates toward the defect. The
globes are symmetric. No exophthalmos.  The lenses are located.
There is mild stranding of the intraconal fat (image 48 series 3)
however no large retrobulbar hematoma.

Underpneumatized right frontal sinus.    Small amount of blood
layers dependently within the left maxillary sinus. Otherwise, the
visualized paranasal sinuses are clear.

Intact nasal bones, nasal septum, pterygoid plates, zygomatic
arches, and mandible.

Upper cervical spine intact.
IMPRESSION: Left inferior and medial orbital wall fractures.  There is
herniation of extracoronal fat into the defects.  In addition, the
inferior rectus muscle herniates into the floor defect/maxillary
sinus and demonstrates mild surrounding stranding.  Recommend
clinical correlation for entrapment.

There is mild stranding of the intraconal fat however no large
retrobulbar hematoma.

Discussed via telephone with Dr. Schulte [DATE] p.m. on
08/22/2011.

## 2014-07-08 ENCOUNTER — Encounter (HOSPITAL_COMMUNITY): Payer: Self-pay | Admitting: Emergency Medicine

## 2014-07-08 ENCOUNTER — Emergency Department (HOSPITAL_COMMUNITY): Payer: 59

## 2014-07-08 ENCOUNTER — Emergency Department (HOSPITAL_COMMUNITY)
Admission: EM | Admit: 2014-07-08 | Discharge: 2014-07-09 | Disposition: A | Discharge status: Home / Self Care | Payer: 59 | Attending: Emergency Medicine | Admitting: Emergency Medicine

## 2014-07-08 DIAGNOSIS — R05 Cough: Secondary | ICD-10-CM

## 2014-07-08 DIAGNOSIS — R509 Fever, unspecified: Secondary | ICD-10-CM

## 2014-07-08 DIAGNOSIS — J189 Pneumonia, unspecified organism: Secondary | ICD-10-CM

## 2014-07-08 DIAGNOSIS — R059 Cough, unspecified: Secondary | ICD-10-CM

## 2014-07-08 DIAGNOSIS — J159 Unspecified bacterial pneumonia: Secondary | ICD-10-CM | POA: Insufficient documentation

## 2014-07-08 DIAGNOSIS — Z720 Tobacco use: Secondary | ICD-10-CM | POA: Insufficient documentation

## 2014-07-08 DIAGNOSIS — Z8781 Personal history of (healed) traumatic fracture: Secondary | ICD-10-CM | POA: Diagnosis not present

## 2014-07-08 HISTORY — DX: Cannabis abuse, uncomplicated: F12.10

## 2014-07-08 LAB — CBC WITH DIFFERENTIAL/PLATELET
Basophils Absolute: 0 10*3/uL (ref 0.0–0.1)
Basophils Relative: 0 % (ref 0–1)
EOS ABS: 0 10*3/uL (ref 0.0–0.7)
Eosinophils Relative: 0 % (ref 0–5)
HEMATOCRIT: 46.1 % (ref 39.0–52.0)
Hemoglobin: 16.6 g/dL (ref 13.0–17.0)
LYMPHS PCT: 19 % (ref 12–46)
Lymphs Abs: 1.5 10*3/uL (ref 0.7–4.0)
MCH: 27.7 pg (ref 26.0–34.0)
MCHC: 36 g/dL (ref 30.0–36.0)
MCV: 76.8 fL — ABNORMAL LOW (ref 78.0–100.0)
MONO ABS: 1.2 10*3/uL — AB (ref 0.1–1.0)
Monocytes Relative: 15 % — ABNORMAL HIGH (ref 3–12)
NEUTROS ABS: 5.3 10*3/uL (ref 1.7–7.7)
NEUTROS PCT: 66 % (ref 43–77)
PLATELETS: 196 10*3/uL (ref 150–400)
RBC: 6 MIL/uL — ABNORMAL HIGH (ref 4.22–5.81)
RDW: 12.4 % (ref 11.5–15.5)
WBC: 8.1 10*3/uL (ref 4.0–10.5)

## 2014-07-08 LAB — COMPREHENSIVE METABOLIC PANEL
ALK PHOS: 54 U/L (ref 39–117)
ALT: 88 U/L — AB (ref 0–53)
AST: 165 U/L — AB (ref 0–37)
Albumin: 4.6 g/dL (ref 3.5–5.2)
Anion gap: 15 (ref 5–15)
BUN: 13 mg/dL (ref 6–23)
CALCIUM: 9.4 mg/dL (ref 8.4–10.5)
CO2: 24 mmol/L (ref 19–32)
Chloride: 95 mmol/L — ABNORMAL LOW (ref 96–112)
Creatinine, Ser: 1.1 mg/dL (ref 0.50–1.35)
GFR calc Af Amer: >90 mL/min (ref >90)
GFR calc non Af Amer: >90 mL/min (ref >90)
Glucose, Bld: 100 mg/dL — ABNORMAL HIGH (ref 70–99)
POTASSIUM: 3.1 mmol/L — AB (ref 3.5–5.1)
SODIUM: 134 mmol/L — AB (ref 135–145)
TOTAL PROTEIN: 8.1 g/dL (ref 6.0–8.3)
Total Bilirubin: 0.8 mg/dL (ref 0.3–1.2)

## 2014-07-08 LAB — I-STAT CG4 LACTIC ACID, ED: Lactic Acid, Venous: 1.07 mmol/L (ref 0.5–2.0)

## 2014-07-08 MED ORDER — DEXTROSE 5 % IV SOLN
1.0000 g | INTRAVENOUS | Status: DC
  Administered 2014-07-09: 1 g via INTRAVENOUS
  Filled 2014-07-08: qty 10

## 2014-07-08 MED ORDER — SODIUM CHLORIDE 0.9 % IV SOLN: INTRAVENOUS | Status: DC

## 2014-07-08 MED ORDER — DEXTROSE 5 % IV SOLN
500.0000 mg | INTRAVENOUS | Status: DC
  Administered 2014-07-09: 500 mg via INTRAVENOUS
  Filled 2014-07-08: qty 500

## 2014-07-08 MED ORDER — POTASSIUM CHLORIDE CRYS ER 20 MEQ PO TBCR
40.0000 meq | EXTENDED_RELEASE_TABLET | Freq: Once | ORAL | Status: AC
  Administered 2014-07-09: 40 meq via ORAL
  Filled 2014-07-08: qty 2

## 2014-07-08 MED ORDER — METOCLOPRAMIDE HCL 5 MG/ML IJ SOLN
5.0000 mg | Freq: Once | INTRAMUSCULAR | Status: AC
  Administered 2014-07-09: 5 mg via INTRAVENOUS
  Filled 2014-07-08: qty 2

## 2014-07-08 MED ORDER — SODIUM CHLORIDE 0.9 % IV BOLUS (SEPSIS)
1000.0000 mL | Freq: Once | INTRAVENOUS | Status: AC
  Administered 2014-07-09: 1000 mL via INTRAVENOUS

## 2014-07-08 MED ORDER — DIPHENHYDRAMINE HCL 50 MG/ML IJ SOLN
12.5000 mg | Freq: Once | INTRAMUSCULAR | Status: AC
  Administered 2014-07-09: 12.5 mg via INTRAVENOUS
  Filled 2014-07-08: qty 1

## 2014-07-08 MED ORDER — KETOROLAC TROMETHAMINE 30 MG/ML IJ SOLN
30.0000 mg | Freq: Once | INTRAMUSCULAR | Status: AC
  Administered 2014-07-09: 30 mg via INTRAVENOUS
  Filled 2014-07-08: qty 1

## 2014-07-08 MED ORDER — AZITHROMYCIN 250 MG PO TABS: ORAL_TABLET | ORAL | Status: AC

## 2014-07-08 MED ORDER — BENZONATATE 100 MG PO CAPS: 100.0000 mg | ORAL_CAPSULE | Freq: Three times a day (TID) | ORAL | Status: AC

## 2014-07-08 NOTE — ED Notes (Signed)
Pt. reports fever with dry cough , chills and emesis onset this week .
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
## 2014-07-09 LAB — I-STAT CG4 LACTIC ACID, ED: Lactic Acid, Venous: 1.04 mmol/L (ref 0.5–2.0)

## 2014-07-09 NOTE — ED Notes (Signed)
Patient stated he has been feeling bad for about 1 week.  Has taken Tylenol and Dayquil without any relief.  Stated he has been coughing for several days and unable to work for about 1 week due to feeling bad.

## 2014-07-09 NOTE — ED Notes (Signed)
Dr Mora Bellmanni aware of patients feeling better.  Will discharge.

## 2014-07-09 NOTE — ED Notes (Signed)
Discharge instructions and prescriptions reviewed, voiced understanding. 

## 2014-07-09 NOTE — Discharge Instructions (Signed)
